# Patient Record
Sex: Female | Born: 1955 | Race: White | Hispanic: No | Marital: Married | Smoking: Never smoker
Health system: Southern US, Community
[De-identification: ages and names within clinical notes are randomized; demographics above are authoritative.]

## PROBLEM LIST (undated history)

## (undated) HISTORY — PX: TONSILLECTOMY: SUR1361

---

## 1999-10-18 ENCOUNTER — Other Ambulatory Visit: Admission: RE | Admit: 1999-10-18 | Discharge: 1999-10-18 | Payer: Self-pay | Admitting: Obstetrics & Gynecology

## 2001-02-19 ENCOUNTER — Emergency Department (HOSPITAL_COMMUNITY): Admission: EM | Admit: 2001-02-19 | Discharge: 2001-02-19 | Payer: Self-pay | Admitting: Emergency Medicine

## 2001-03-19 ENCOUNTER — Encounter: Payer: Self-pay | Admitting: Internal Medicine

## 2001-03-19 ENCOUNTER — Ambulatory Visit (HOSPITAL_COMMUNITY): Admission: RE | Admit: 2001-03-19 | Discharge: 2001-03-19 | Payer: Self-pay | Admitting: Internal Medicine

## 2001-03-22 ENCOUNTER — Encounter: Payer: Self-pay | Admitting: Internal Medicine

## 2001-03-22 ENCOUNTER — Ambulatory Visit (HOSPITAL_COMMUNITY): Admission: RE | Admit: 2001-03-22 | Discharge: 2001-03-22 | Payer: Self-pay | Admitting: Internal Medicine

## 2001-04-07 ENCOUNTER — Ambulatory Visit (HOSPITAL_COMMUNITY): Admission: RE | Admit: 2001-04-07 | Discharge: 2001-04-07 | Payer: Self-pay | Admitting: Internal Medicine

## 2001-04-08 ENCOUNTER — Encounter: Payer: Self-pay | Admitting: Internal Medicine

## 2001-05-28 ENCOUNTER — Ambulatory Visit (HOSPITAL_COMMUNITY): Admission: RE | Admit: 2001-05-28 | Discharge: 2001-05-28 | Payer: Self-pay | Admitting: Internal Medicine

## 2003-05-06 ENCOUNTER — Other Ambulatory Visit: Admission: RE | Admit: 2003-05-06 | Discharge: 2003-05-06 | Payer: Self-pay | Admitting: Obstetrics and Gynecology

## 2004-04-25 ENCOUNTER — Other Ambulatory Visit: Admission: RE | Admit: 2004-04-25 | Discharge: 2004-04-25 | Payer: Self-pay | Admitting: Obstetrics and Gynecology

## 2005-05-23 ENCOUNTER — Other Ambulatory Visit: Admission: RE | Admit: 2005-05-23 | Discharge: 2005-05-23 | Payer: Self-pay | Admitting: Obstetrics and Gynecology

## 2016-12-31 DIAGNOSIS — Z1389 Encounter for screening for other disorder: Secondary | ICD-10-CM | POA: Diagnosis not present

## 2016-12-31 DIAGNOSIS — R0602 Shortness of breath: Secondary | ICD-10-CM | POA: Diagnosis not present

## 2017-04-14 DIAGNOSIS — Z803 Family history of malignant neoplasm of breast: Secondary | ICD-10-CM | POA: Diagnosis not present

## 2017-04-14 DIAGNOSIS — Z1231 Encounter for screening mammogram for malignant neoplasm of breast: Secondary | ICD-10-CM | POA: Diagnosis not present

## 2018-03-30 DIAGNOSIS — Z01419 Encounter for gynecological examination (general) (routine) without abnormal findings: Secondary | ICD-10-CM | POA: Diagnosis not present

## 2018-03-30 DIAGNOSIS — Z124 Encounter for screening for malignant neoplasm of cervix: Secondary | ICD-10-CM | POA: Diagnosis not present

## 2018-04-20 DIAGNOSIS — N950 Postmenopausal bleeding: Secondary | ICD-10-CM | POA: Diagnosis not present

## 2018-04-27 DIAGNOSIS — Z1211 Encounter for screening for malignant neoplasm of colon: Secondary | ICD-10-CM | POA: Diagnosis not present

## 2018-04-27 DIAGNOSIS — Z1231 Encounter for screening mammogram for malignant neoplasm of breast: Secondary | ICD-10-CM | POA: Diagnosis not present

## 2018-04-27 DIAGNOSIS — Z803 Family history of malignant neoplasm of breast: Secondary | ICD-10-CM | POA: Diagnosis not present

## 2018-05-01 DIAGNOSIS — N6002 Solitary cyst of left breast: Secondary | ICD-10-CM | POA: Diagnosis not present

## 2018-06-22 DIAGNOSIS — E663 Overweight: Secondary | ICD-10-CM | POA: Diagnosis not present

## 2018-06-22 DIAGNOSIS — E782 Mixed hyperlipidemia: Secondary | ICD-10-CM | POA: Diagnosis not present

## 2018-06-22 DIAGNOSIS — Z1389 Encounter for screening for other disorder: Secondary | ICD-10-CM | POA: Diagnosis not present

## 2018-06-22 DIAGNOSIS — E063 Autoimmune thyroiditis: Secondary | ICD-10-CM | POA: Diagnosis not present

## 2018-06-22 DIAGNOSIS — R7309 Other abnormal glucose: Secondary | ICD-10-CM | POA: Diagnosis not present

## 2019-07-12 DIAGNOSIS — E063 Autoimmune thyroiditis: Secondary | ICD-10-CM | POA: Diagnosis not present

## 2019-07-12 DIAGNOSIS — Z6827 Body mass index (BMI) 27.0-27.9, adult: Secondary | ICD-10-CM | POA: Diagnosis not present

## 2019-07-12 DIAGNOSIS — E663 Overweight: Secondary | ICD-10-CM | POA: Diagnosis not present

## 2019-08-11 ENCOUNTER — Encounter (INDEPENDENT_AMBULATORY_CARE_PROVIDER_SITE_OTHER): Payer: Self-pay

## 2019-08-11 ENCOUNTER — Encounter: Payer: Self-pay | Admitting: "Endocrinology

## 2019-08-11 ENCOUNTER — Ambulatory Visit: Payer: BC Managed Care – PPO | Admitting: "Endocrinology

## 2019-08-11 ENCOUNTER — Other Ambulatory Visit: Payer: Self-pay

## 2019-08-11 VITALS — BP 113/79 | HR 76 | Ht 62.75 in | Wt 163.0 lb

## 2019-08-11 DIAGNOSIS — E039 Hypothyroidism, unspecified: Secondary | ICD-10-CM

## 2019-08-11 NOTE — Progress Notes (Signed)
Endocrinology Consult Note                                            08/11/2019, 3:54 PM   Subjective:    Patient ID: Lynn Velasquez, female    DOB: 12/28/1955, PCP Benita StabileHall, John Z, MD   History reviewed. No pertinent past medical history. Past Surgical History:  Procedure Laterality Date  . CESAREAN SECTION    . TONSILLECTOMY     Social History   Socioeconomic History  . Marital status: Married    Spouse name: Not on file  . Number of children: Not on file  . Years of education: Not on file  . Highest education level: Not on file  Occupational History  . Not on file  Social Needs  . Financial resource strain: Not on file  . Food insecurity    Worry: Not on file    Inability: Not on file  . Transportation needs    Medical: Not on file    Non-medical: Not on file  Tobacco Use  . Smoking status: Never Smoker  . Smokeless tobacco: Never Used  Substance and Sexual Activity  . Alcohol use: Yes    Comment: Ocassional  . Drug use: Never  . Sexual activity: Not on file  Lifestyle  . Physical activity    Days per week: Not on file    Minutes per session: Not on file  . Stress: Not on file  Relationships  . Social Musicianconnections    Talks on phone: Not on file    Gets together: Not on file    Attends religious service: Not on file    Active member of club or organization: Not on file    Attends meetings of clubs or organizations: Not on file    Relationship status: Not on file  Other Topics Concern  . Not on file  Social History Narrative  . Not on file   Family History  Problem Relation Age of Onset  . Thyroid disease Mother   . CAD Mother   . Stroke Mother   . Breast cancer Mother    Outpatient Encounter Medications as of 08/11/2019  Medication Sig  . pravastatin (PRAVACHOL) 40 MG tablet Take 40 mg by mouth daily.  Marland Kitchen. buPROPion (WELLBUTRIN XL) 150 MG 24 hr tablet daily.  Marland Kitchen. levothyroxine (SYNTHROID) 125 MCG tablet daily.   No facility-administered  encounter medications on file as of 08/11/2019.    ALLERGIES: No Known Allergies  VACCINATION STATUS:  There is no immunization history on file for this patient.  HPI Lynn Velasquez is 63 y.o. female who presents today with a medical history as above. she is being seen in consultation for hypothyroidism requested by Benita StabileHall, John Z, MD.  -History is obtained directly from the patient.  She is not a good historian, however reports that she was diagnosed with hypothyroidism approximately 10 years ago.  She has taken levothyroxine for the same duration of time.  She is currently on levothyroxine 125 mcg p.o. daily.  -She has been taking this medication along with her other medications with breakfast.  She denies dysphagia, shortness of breath, nor voice change.  She denies palpitations, tremors, heat intolerance.  She has been dealing with fluctuating body weight. She does not have recent thyroid function test to review, TSH was 6.9 on June 22, 2018.  Review of Systems  Constitutional:  + Fluctuating body weight, no fatigue, no subjective hyperthermia, no subjective hypothermia Eyes: no blurry vision, no xerophthalmia ENT: no sore throat, no nodules palpated in throat, no dysphagia/odynophagia, no hoarseness Cardiovascular: no Chest Pain, no Shortness of Breath, no palpitations, no leg swelling Respiratory: no cough, no shortness of breath Gastrointestinal: no Nausea/Vomiting/Diarhhea Musculoskeletal: no muscle/joint aches Skin: no rashes Neurological: no tremors, no numbness, no tingling, no dizziness Psychiatric: no depression, no anxiety  Objective:    BP 113/79   Pulse 76   Ht 5' 2.75" (1.594 m)   Wt 163 lb (73.9 kg)   BMI 29.10 kg/m   Wt Readings from Last 3 Encounters:  08/11/19 163 lb (73.9 kg)    Physical Exam  Constitutional:  Body mass index is 29.1 kg/m.,  not in acute distress, normal state of mind Eyes: PERRLA, EOMI, no exophthalmos ENT: moist mucous  membranes, no gross thyromegaly, no gross cervical lymphadenopathy Cardiovascular: normal precordial activity, Regular Rate and Rhythm, no Murmur/Rubs/Gallops Respiratory:  adequate breathing efforts, no gross chest deformity, Clear to auscultation bilaterally Gastrointestinal: abdomen soft, Non -tender, No distension, Bowel Sounds present, no gross organomegaly Musculoskeletal: no gross deformities, strength intact in all four extremities Skin: moist, warm, no rashes Neurological: no tremor with outstretched hands, Deep tendon reflexes normal in bilateral lower extremities.   TSH 6.94 on June 22, 2018   Assessment & Plan:   1. Hypothyroidism, unspecified type  - Lynn Velasquez  is being seen at a kind request of [place] Crane, Edwinna Areola, MD. - I have reviewed her available thyroid records and clinically evaluated the patient. - Based on these reviews, she has longstanding hypothyroidism on levothyroxine.  However she does not have recent thyroid function tests to review. -She did have thyroid biopsy in 2002, per patient results are not available.  Patient was informed that it was benign.  She agrees to go to lab today for a new set of thyroid function tests. In the meantime, she is advised to continue levothyroxine 125 mcg p.o. daily before breakfast.   - We discussed about the correct intake of her thyroid hormone, on empty stomach at fasting, with water, separated by at least 30 minutes from breakfast and other medications,  and separated by more than 4 hours from calcium, iron, multivitamins, acid reflux medications (PPIs). -Patient is made aware of the fact that thyroid hormone replacement is needed for life, dose to be adjusted by periodic monitoring of thyroid function tests.  - I did not initiate any new prescriptions today.  She will be on phone visit in 7 days to discuss her labs and treatment adjustment if necessary.  Due to absence of gross goiter, she will not thyroid imaging at  this time,. -However, due to palpable thyroid and history of multinodular goiter, she will be considered for thyroid ultrasound in 1 year.   - she is advised to maintain close follow up with Celene Squibb, MD for primary care needs.   - Time spent with the patient: 60 minutes, of which >50% was spent in obtaining information about her symptoms, reviewing her previous labs/studies,  evaluations, and treatments, counseling her about her hypothyroidism, and developing a plan to confirm the diagnosis and long term treatment based on the latest standards of care/guidelines.    Crista Luria participated in the discussions, expressed understanding, and voiced agreement with the above plans.  All questions were answered to her satisfaction. she is encouraged to  contact clinic should she have any questions or concerns prior to her return visit.  Follow up plan: Return in about 1 week (around 08/18/2019), or Phone, for Labs Today- Non-Fasting Ok.   Marquis Lunch, MD [facility] [address] [place], [place] [address] Phone: [phone]  Fax: [phone]     08/11/2019, 3:54 PM  This note was partially dictated with voice recognition software. Similar sounding words can be transcribed inadequately or may not  be corrected upon review.

## 2019-08-12 LAB — T4, FREE: Free T4: 1.8 ng/dL (ref 0.8–1.8)

## 2019-08-12 LAB — TSH: TSH: 0.09 mIU/L — ABNORMAL LOW (ref 0.40–4.50)

## 2019-08-19 ENCOUNTER — Ambulatory Visit (INDEPENDENT_AMBULATORY_CARE_PROVIDER_SITE_OTHER): Payer: BC Managed Care – PPO | Admitting: "Endocrinology

## 2019-08-19 ENCOUNTER — Other Ambulatory Visit: Payer: Self-pay

## 2019-08-19 ENCOUNTER — Encounter: Payer: Self-pay | Admitting: "Endocrinology

## 2019-08-19 ENCOUNTER — Telehealth: Payer: Self-pay | Admitting: "Endocrinology

## 2019-08-19 DIAGNOSIS — E039 Hypothyroidism, unspecified: Secondary | ICD-10-CM

## 2019-08-19 MED ORDER — LEVOTHYROXINE SODIUM 112 MCG PO TABS: 112.0000 ug | ORAL_TABLET | Freq: Every day | ORAL | 1 refills | Status: DC

## 2019-08-19 NOTE — Progress Notes (Signed)
08/19/2019, 5:56 PM                                Endocrinology Telehealth Visit Follow up Note -During COVID -19 Pandemic  I connected with Lynn Velasquez on 08/19/2019   by telephone and verified that I am speaking with the correct person using two identifiers. Lynn Velasquez, Lynn Velasquez. she has verbally consented to this visit. All issues noted in this document were discussed and addressed. The format was not optimal for physical exam.   Subjective:    Patient ID: Lynn Velasquez, female    DOB: [birth year], PCP Lynn Stabile, MD   History reviewed. No pertinent past medical history. Past Surgical History:  Procedure Laterality Date  . CESAREAN SECTION    . TONSILLECTOMY     Social History   Socioeconomic History  . Marital status: Married    Spouse name: Not on file  . Number of children: Not on file  . Years of education: Not on file  . Highest education level: Not on file  Occupational History  . Not on file  Social Needs  . Financial resource strain: Not on file  . Food insecurity    Worry: Not on file    Inability: Not on file  . Transportation needs    Medical: Not on file    Non-medical: Not on file  Tobacco Use  . Smoking status: Never Smoker  . Smokeless tobacco: Never Used  Substance and Sexual Activity  . Alcohol use: Yes    Comment: Ocassional  . Drug use: Never  . Sexual activity: Not on file  Lifestyle  . Physical activity    Days per week: Not on file    Minutes per session: Not on file  . Stress: Not on file  Relationships  . Social Musician on phone: Not on file    Gets together: Not on file    Attends religious service: Not on file    Active member of club or organization: Not on file    Attends meetings of clubs or organizations: Not on file    Relationship status: Not on file  Other Topics Concern  . Not on file  Social History Narrative  . Not on file    Family History  Problem Relation Age of Onset  . Thyroid disease Mother   . CAD Mother   . Stroke Mother   . Breast cancer Mother    Outpatient Encounter Medications as of 08/19/2019  Medication Sig  . buPROPion (WELLBUTRIN XL) 150 MG 24 hr tablet daily.  Marland Kitchen levothyroxine (SYNTHROID) 112 MCG tablet Take 1 tablet (112 mcg total) by mouth daily before breakfast.  . pravastatin (PRAVACHOL) 40 MG tablet Take 40 mg by mouth daily.  . [DISCONTINUED] levothyroxine (SYNTHROID) 125 MCG tablet daily.   No facility-administered encounter medications on file as of 08/19/2019.    ALLERGIES: No Known Allergies  VACCINATION STATUS:  There is no immunization history on file for this patient.  HPI Lynn Velasquez is 63 y.o. female who who is being engaged in telehealth via telephone for follow-up after she  was seen in consultation for hypothyroidism.  She was sent for a new set of thyroid function test during her last visit.       She is not a good historian, however reports that she was diagnosed with hypothyroidism approximately 10 years ago.  She has taken levothyroxine for the same duration of time.  She is currently on levothyroxine 125 mcg p.o. daily.  -She has been taking this medication along with her other medications with breakfast.  She denies dysphagia, shortness of breath, nor voice change.  She denies palpitations, tremors, heat intolerance.  She has been dealing with fluctuating body weight. She does not have recent thyroid function test to review, TSH was 6.9 on June 22, 2018. Her most recent thyroid function tests are consistent with slight over replacement.  Review of Systems Limited as above.  Objective:    There were no vitals taken for this visit.  Wt Readings from Last 3 Encounters:  08/11/19 163 lb (73.9 kg)    Physical Exam  Recent Results (from the past 2160 hour(s))  T4, free SOLSTAS     Status: None   Collection Time: 08/11/19  3:54 PM  Result Value  Ref Range   Free T4 1.8 0.8 - 1.8 ng/dL  TSH SOLSTAS     Status: Abnormal   Collection Time: 08/11/19  3:54 PM  Result Value Ref Range   TSH 0.09 (L) 0.40 - 4.50 mIU/L     TSH 6.94 on June 22, 2018   Assessment & Plan:   1. Hypothyroidism, unspecified type   Her previsit thyroid function tests are consistent with slight over replacement.  I discussed and lowered her levothyroxine to 112 mcg p.o. daily before breakfast.  - We discussed about the correct intake of her thyroid hormone, on empty stomach at fasting, with water, separated by at least 30 minutes from breakfast and other medications,  and separated by more than 4 hours from calcium, iron, multivitamins, acid reflux medications (PPIs). -Patient is made aware of the fact that thyroid hormone replacement is needed for life, dose to be adjusted by periodic monitoring of thyroid function tests.  -She did have thyroid biopsy in 2002, per patient results are not available.  Patient was informed that it was benign. -However, due to palpable thyroid and history of multinodular goiter, she will be considered for thyroid ultrasound in 1 year.      - she is advised to maintain close follow up with Lynn Squibb, MD for primary care needs.  Time for this visit: 15 minutes. Lynn Velasquez  participated in the discussions, expressed understanding, and voiced agreement with the above plans.  All questions were answered to her satisfaction. she is encouraged to contact clinic should she have any questions or concerns prior to her return visit.  Follow up plan: Return in about 4 months (around 12/17/2019) for Follow up with Pre-visit Labs.   Lynn Lloyd, MD [facility] [address] [place], [place] [address] Phone: [phone]  Fax: [phone]     08/19/2019, 5:56 PM  This note was partially dictated with voice recognition software. Similar sounding words can be  transcribed inadequately or may not  be corrected upon review.

## 2019-08-19 NOTE — Telephone Encounter (Signed)
Pt called upset that I left a VM on her home answering machine to remind her of her phone visit today, patient called back yelling at me that I am to not call her husband, advised patient that I never spoke with her husband that I left a message on a home phone, patient gave me her cell and hung up

## 2019-08-23 NOTE — Telephone Encounter (Signed)
Patient came into the office and said she wanted to fill out a new DPR. Patient filled out & I made her a copy and handed to her. New copy of DPR on file.

## 2019-09-02 DIAGNOSIS — E785 Hyperlipidemia, unspecified: Secondary | ICD-10-CM | POA: Diagnosis not present

## 2019-09-02 DIAGNOSIS — E039 Hypothyroidism, unspecified: Secondary | ICD-10-CM | POA: Diagnosis not present

## 2019-09-02 DIAGNOSIS — F331 Major depressive disorder, recurrent, moderate: Secondary | ICD-10-CM | POA: Diagnosis not present

## 2019-09-02 DIAGNOSIS — Z0189 Encounter for other specified special examinations: Secondary | ICD-10-CM | POA: Diagnosis not present

## 2019-12-15 LAB — TSH: TSH: 0.23 mIU/L — ABNORMAL LOW (ref 0.40–4.50)

## 2019-12-15 LAB — T4, FREE: Free T4: 1.3 ng/dL (ref 0.8–1.8)

## 2019-12-17 ENCOUNTER — Ambulatory Visit: Payer: BC Managed Care – PPO | Admitting: "Endocrinology

## 2019-12-20 ENCOUNTER — Ambulatory Visit: Payer: BC Managed Care – PPO | Admitting: "Endocrinology

## 2020-01-05 ENCOUNTER — Ambulatory Visit (INDEPENDENT_AMBULATORY_CARE_PROVIDER_SITE_OTHER): Payer: BC Managed Care – PPO | Admitting: "Endocrinology

## 2020-01-05 ENCOUNTER — Encounter: Payer: Self-pay | Admitting: "Endocrinology

## 2020-01-05 DIAGNOSIS — E039 Hypothyroidism, unspecified: Secondary | ICD-10-CM

## 2020-01-05 MED ORDER — LEVOTHYROXINE SODIUM 112 MCG PO TABS: 112.0000 ug | ORAL_TABLET | Freq: Every day | ORAL | 1 refills | Status: DC

## 2020-01-05 NOTE — Progress Notes (Signed)
01/05/2020, 2:53 PM                                    Endocrinology Telehealth Visit Follow up Note -During COVID -19 Pandemic  I connected with Lynn Velasquez on 01/05/2020   by telephone and verified that I am speaking with the correct person using two identifiers. Lynn Velasquez, 24-Dec-1955. she has verbally consented to this visit. All issues noted in this document were discussed and addressed. The format was not optimal for physical exam.   Subjective:    Patient ID: Lynn Velasquez, female    DOB: 1956/05/25, PCP Benita Stabile, MD   History reviewed. No pertinent past medical history. Past Surgical History:  Procedure Laterality Date  . CESAREAN SECTION    . TONSILLECTOMY     Social History   Socioeconomic History  . Marital status: Married    Spouse name: Not on file  . Number of children: Not on file  . Years of education: Not on file  . Highest education level: Not on file  Occupational History  . Not on file  Tobacco Use  . Smoking status: Never Smoker  . Smokeless tobacco: Never Used  Substance and Sexual Activity  . Alcohol use: Yes    Comment: Ocassional  . Drug use: Never  . Sexual activity: Not on file  Other Topics Concern  . Not on file  Social History Narrative  . Not on file   Social Determinants of Health   Financial Resource Strain:   . Difficulty of Paying Living Expenses:   Food Insecurity:   . Worried About Programme researcher, broadcasting/film/video in the Last Year:   . Barista in the Last Year:   Transportation Needs:   . Freight forwarder (Medical):   Marland Kitchen Lack of Transportation (Non-Medical):   Physical Activity:   . Days of Exercise per Week:   . Minutes of Exercise per Session:   Stress:   . Feeling of Stress :   Social Connections:   . Frequency of Communication with Friends and Family:   . Frequency of Social Gatherings with Friends and Family:   . Attends Religious Services:    . Active Member of Clubs or Organizations:   . Attends Banker Meetings:   Marland Kitchen Marital Status:    Family History  Problem Relation Age of Onset  . Thyroid disease Mother   . CAD Mother   . Stroke Mother   . Breast cancer Mother    Outpatient Encounter Medications as of 01/05/2020  Medication Sig  . buPROPion (WELLBUTRIN XL) 150 MG 24 hr tablet daily.  Marland Kitchen levothyroxine (SYNTHROID) 112 MCG tablet Take 1 tablet (112 mcg total) by mouth daily before breakfast.  . pravastatin (PRAVACHOL) 40 MG tablet Take 40 mg by mouth daily.  . [DISCONTINUED] levothyroxine (SYNTHROID) 112 MCG tablet Take 1 tablet (112 mcg total) by mouth daily before breakfast.   No facility-administered encounter medications on file as of 01/05/2020.   ALLERGIES: No Known Allergies  VACCINATION STATUS:  There is no immunization history on file for this patient.  HPI Lynn Velasquez is 64 y.o. female who who is engaged in telehealth via telephone in follow-up for hypothyroidism.       She was diagnosed with hypothyroidism at approximate age of 21 years.  She has taken levothyroxine for the same duration of time.  She is currently on levothyroxine 112 mcg p.o. daily.  -She has been taking this medication along with her other medications with breakfast.  She denies dysphagia, shortness of breath, nor voice change.  She denies palpitations, tremors, heat intolerance.  She has been dealing with fluctuating body weight, however thinks it is steady since last visit.  Review of Systems Limited as above.  Objective:    There were no vitals taken for this visit.  Wt Readings from Last 3 Encounters:  08/11/19 163 lb (73.9 kg)    Physical Exam   Recent Results (from the past 2160 hour(s))  TSH SOLSTAS     Status: Abnormal   Collection Time: 12/14/19  4:21 PM  Result Value Ref Range   TSH 0.23 (L) 0.40 - 4.50 mIU/L  T4, free SOLSTAS     Status: None   Collection Time: 12/14/19  4:21 PM  Result Value  Ref Range   Free T4 1.3 0.8 - 1.8 ng/dL    Assessment & Plan:   1. Hypothyroidism   Her previsit thyroid function tests are consistent with improving profile and appropriate replacement.  She is advised to continue levothyroxine  112 mcg p.o. daily before breakfast.  - We discussed about the correct intake of her thyroid hormone, on empty stomach at fasting, with water, separated by at least 30 minutes from breakfast and other medications,  and separated by more than 4 hours from calcium, iron, multivitamins, acid reflux medications (PPIs). -Patient is made aware of the fact that thyroid hormone replacement is needed for life, dose to be adjusted by periodic monitoring of thyroid function tests.  -She did have thyroid biopsy in 2002, per patient results are not available.  Patient was informed that it was benign. -However, due to palpable thyroid and history of multinodular goiter, she will be considered for thyroid ultrasound in 1 year.    - she is advised to maintain close follow up with Celene Squibb, MD for primary care needs.     - Time spent on this patient care encounter:  25 minutes of which 50% was spent in  counseling and the rest reviewing  her current and  previous labs / studies and medications  doses and developing a plan for long term care. Crista Luria  participated in the discussions, expressed understanding, and voiced agreement with the above plans.  All questions were answered to her satisfaction. she is encouraged to contact clinic should she have any questions or concerns prior to her return visit.   Follow up plan: Return in about 9 weeks (around 03/08/2020) for Follow up with Pre-visit Labs.   Glade Lloyd, MD [facility] [address] [place], [place] [address] Phone: [phone]  Fax: [phone]     01/05/2020, 2:53 PM  This note was partially dictated with voice recognition software. Similar  sounding words can be transcribed inadequately or may not  be corrected upon review.

## 2020-03-08 LAB — TSH: TSH: 0.09 mIU/L — ABNORMAL LOW (ref 0.40–4.50)

## 2020-03-08 LAB — T4, FREE: Free T4: 1.5 ng/dL (ref 0.8–1.8)

## 2020-03-09 ENCOUNTER — Other Ambulatory Visit: Payer: Self-pay

## 2020-03-09 ENCOUNTER — Ambulatory Visit (INDEPENDENT_AMBULATORY_CARE_PROVIDER_SITE_OTHER): Payer: BC Managed Care – PPO | Admitting: "Endocrinology

## 2020-03-09 ENCOUNTER — Encounter: Payer: Self-pay | Admitting: "Endocrinology

## 2020-03-09 VITALS — BP 103/64 | HR 71 | Ht 62.0 in | Wt 161.4 lb

## 2020-03-09 DIAGNOSIS — E049 Nontoxic goiter, unspecified: Secondary | ICD-10-CM

## 2020-03-09 DIAGNOSIS — E039 Hypothyroidism, unspecified: Secondary | ICD-10-CM | POA: Diagnosis not present

## 2020-03-09 MED ORDER — LEVOTHYROXINE SODIUM 100 MCG PO TABS: 100.0000 ug | ORAL_TABLET | Freq: Every day | ORAL | 1 refills | Status: DC

## 2020-03-09 NOTE — Addendum Note (Signed)
Addended by: Roma Kayser on: 03/09/2020 03:53 PM   Modules accepted: Orders

## 2020-03-09 NOTE — Progress Notes (Signed)
03/09/2020, 3:16 PM         Endocrinology follow-up note   Subjective:    Patient ID: Lynn Velasquez, female    DOB: 02/07/1956, PCP Celene Squibb, MD   History reviewed. No pertinent past medical history. Past Surgical History:  Procedure Laterality Date  . CESAREAN SECTION    . TONSILLECTOMY     Social History   Socioeconomic History  . Marital status: Married    Spouse name: Not on file  . Number of children: Not on file  . Years of education: Not on file  . Highest education level: Not on file  Occupational History  . Not on file  Tobacco Use  . Smoking status: Never Smoker  . Smokeless tobacco: Never Used  Vaping Use  . Vaping Use: Never used  Substance and Sexual Activity  . Alcohol use: Yes    Comment: Ocassional  . Drug use: Never  . Sexual activity: Not on file  Other Topics Concern  . Not on file  Social History Narrative  . Not on file   Social Determinants of Health   Financial Resource Strain:   . Difficulty of Paying Living Expenses:   Food Insecurity:   . Worried About Charity fundraiser in the Last Year:   . Arboriculturist in the Last Year:   Transportation Needs:   . Film/video editor (Medical):   Marland Kitchen Lack of Transportation (Non-Medical):   Physical Activity:   . Days of Exercise per Week:   . Minutes of Exercise per Session:   Stress:   . Feeling of Stress :   Social Connections:   . Frequency of Communication with Friends and Family:   . Frequency of Social Gatherings with Friends and Family:   . Attends Religious Services:   . Active Member of Clubs or Organizations:   . Attends Archivist Meetings:   Marland Kitchen Marital Status:    Family History  Problem Relation Age of Onset  . Thyroid disease Mother   . CAD Mother   . Stroke Mother   . Breast cancer Mother    Outpatient Encounter Medications as of 03/09/2020  Medication Sig  . levothyroxine (SYNTHROID) 100 MCG  tablet Take 1 tablet (100 mcg total) by mouth daily before breakfast.  . [DISCONTINUED] buPROPion (WELLBUTRIN XL) 150 MG 24 hr tablet daily.  . [DISCONTINUED] levothyroxine (SYNTHROID) 112 MCG tablet Take 1 tablet (112 mcg total) by mouth daily before breakfast.  . [DISCONTINUED] pravastatin (PRAVACHOL) 40 MG tablet Take 40 mg by mouth daily.   No facility-administered encounter medications on file as of 03/09/2020.   ALLERGIES: No Known Allergies  VACCINATION STATUS:  There is no immunization history on file for this patient.  HPI Lynn Velasquez is 64 y.o. female who who is being seen in follow-up for hypothyroidism, history of nodular goiter.    She was diagnosed with hypothyroidism at approximate age of 73 years.  She is more consistent with this medication and taking it the right way this time.   -Her previsit thyroid function tests are consistent with slight over replacement.   - She denies dysphagia, shortness of breath, nor voice change.  She  reports mild intermittent hot flashes, palpitations.   -She has more steady body weight at this time.    Review of Systems Limited as above.  Objective:    BP 103/64   Pulse 71   Ht 5\' 2"  (1.575 m)   Wt 161 lb 6.4 oz (73.2 kg)   BMI 29.52 kg/m   Wt Readings from Last 3 Encounters:  03/09/20 161 lb 6.4 oz (73.2 kg)  08/11/19 163 lb (73.9 kg)    Physical Exam   Physical Exam- Limited  Constitutional:  Body mass index is 29.52 kg/m. , not in acute distress, normal state of mind Eyes:  EOMI, no exophthalmos Neck: Supple Thyroid: No gross goiter Respiratory: Adequate breathing efforts Musculoskeletal: no gross deformities, strength intact in all four extremities, no gross restriction of joint movements Skin:  no rashes, no hyperemia Neurological: no tremor with outstretched hands,    Recent Results (from the past 2160 hour(s))  TSH SOLSTAS     Status: Abnormal   Collection Time: 12/14/19  4:21 PM  Result Value Ref  Range   TSH 0.23 (L) 0.40 - 4.50 mIU/L  T4, free SOLSTAS     Status: None   Collection Time: 12/14/19  4:21 PM  Result Value Ref Range   Free T4 1.3 0.8 - 1.8 ng/dL  T4, free     Status: None   Collection Time: 03/07/20 10:39 AM  Result Value Ref Range   Free T4 1.5 0.8 - 1.8 ng/dL  TSH     Status: Abnormal   Collection Time: 03/07/20 10:39 AM  Result Value Ref Range   TSH 0.09 (L) 0.40 - 4.50 mIU/L    Assessment & Plan:   1. Hypothyroidism  2.  History of nodular goiter status post biopsy.   Her previsit thyroid function tests are consistent with slight over replacement.  She is approached for a lower dose of levothyroxine and she accepts.  I discussed and prescribed levothyroxine 100 mcg p.o. daily before breakfast.    - We discussed about the correct intake of her thyroid hormone, on empty stomach at fasting, with water, separated by at least 30 minutes from breakfast and other medications,  and separated by more than 4 hours from calcium, iron, multivitamins, acid reflux medications (PPIs). -Patient is made aware of the fact that thyroid hormone replacement is needed for life, dose to be adjusted by periodic monitoring of thyroid function tests.   -She did have thyroid biopsy in 2002, per patient results are not available.  Patient was informed that it was benign. -However, due to palpable thyroid and history of multinodular goiter, she will have surveillance thyroid ultrasound before her next visit.     - she is advised to maintain close follow up with 2003, MD for primary care needs.      - Time spent on this patient care encounter:  20 minutes of which 50% was spent in  counseling and the rest reviewing  her current and  previous labs / studies and medications  doses and developing a plan for long term care. Benita Stabile  participated in the discussions, expressed understanding, and voiced agreement with the above plans.  All questions were answered to her  satisfaction. she is encouraged to contact clinic should she have any questions or concerns prior to her return visit.    Follow up plan: Return in about 6 months (around 09/08/2020) for Thyroid / Neck Ultrasound, F/U with Pre-visit Labs.   14/06/2020,  MD [facility] [address] [place], [place] [address] Phone: [phone]  Fax: [phone]     03/09/2020, 3:16 PM  This note was partially dictated with voice recognition software. Similar sounding words can be transcribed inadequately or may not  be corrected upon review.

## 2020-03-15 ENCOUNTER — Encounter: Payer: Self-pay | Admitting: "Endocrinology

## 2020-09-06 ENCOUNTER — Ambulatory Visit (HOSPITAL_COMMUNITY): Admission: RE | Admit: 2020-09-06 | Payer: BC Managed Care – PPO | Source: Ambulatory Visit

## 2020-09-06 LAB — TSH: TSH: 0.08 mIU/L — ABNORMAL LOW (ref 0.40–4.50)

## 2020-09-06 LAB — THYROGLOBULIN ANTIBODY: Thyroglobulin Ab: 9 IU/mL — ABNORMAL HIGH (ref <=1)

## 2020-09-06 LAB — T4, FREE: Free T4: 1.4 ng/dL (ref 0.8–1.8)

## 2020-09-06 LAB — THYROID PEROXIDASE ANTIBODY: Thyroperoxidase Ab SerPl-aCnc: 632 IU/mL — ABNORMAL HIGH (ref <9)

## 2020-09-07 ENCOUNTER — Telehealth: Payer: Self-pay

## 2020-09-07 NOTE — Telephone Encounter (Signed)
Patient did not do ultrasound but did her labs-do you want to keep her on the schedule for monday

## 2020-09-07 NOTE — Telephone Encounter (Signed)
Yes

## 2020-09-11 ENCOUNTER — Ambulatory Visit (INDEPENDENT_AMBULATORY_CARE_PROVIDER_SITE_OTHER): Payer: BC Managed Care – PPO | Admitting: "Endocrinology

## 2020-09-11 ENCOUNTER — Encounter: Payer: Self-pay | Admitting: "Endocrinology

## 2020-09-11 ENCOUNTER — Other Ambulatory Visit: Payer: Self-pay

## 2020-09-11 VITALS — BP 110/78 | HR 80 | Ht 62.0 in | Wt 160.0 lb

## 2020-09-11 DIAGNOSIS — E038 Other specified hypothyroidism: Secondary | ICD-10-CM

## 2020-09-11 DIAGNOSIS — E049 Nontoxic goiter, unspecified: Secondary | ICD-10-CM

## 2020-09-11 DIAGNOSIS — E063 Autoimmune thyroiditis: Secondary | ICD-10-CM | POA: Diagnosis not present

## 2020-09-11 MED ORDER — LEVOTHYROXINE SODIUM 100 MCG PO TABS: 100.0000 ug | ORAL_TABLET | Freq: Every day | ORAL | 1 refills | Status: DC

## 2020-09-11 NOTE — Progress Notes (Signed)
09/11/2020, 7:28 PM         Endocrinology follow-up note   Subjective:    Patient ID: Lynn Velasquez, female    DOB: 08-30-1956, PCP Benita Stabile, MD   History reviewed. No pertinent past medical history. Past Surgical History:  Procedure Laterality Date  . CESAREAN SECTION    . TONSILLECTOMY     Social History   Socioeconomic History  . Marital status: Married    Spouse name: Not on file  . Number of children: Not on file  . Years of education: Not on file  . Highest education level: Not on file  Occupational History  . Not on file  Tobacco Use  . Smoking status: Never Smoker  . Smokeless tobacco: Never Used  Vaping Use  . Vaping Use: Never used  Substance and Sexual Activity  . Alcohol use: Yes    Comment: Ocassional  . Drug use: Never  . Sexual activity: Not on file  Other Topics Concern  . Not on file  Social History Narrative  . Not on file   Social Determinants of Health   Financial Resource Strain: Not on file  Food Insecurity: Not on file  Transportation Needs: Not on file  Physical Activity: Not on file  Stress: Not on file  Social Connections: Not on file   Family History  Problem Relation Age of Onset  . Thyroid disease Mother   . CAD Mother   . Stroke Mother   . Breast cancer Mother    Outpatient Encounter Medications as of 09/11/2020  Medication Sig  . levothyroxine (SYNTHROID) 100 MCG tablet Take 1 tablet (100 mcg total) by mouth daily before breakfast.  . pravastatin (PRAVACHOL) 40 MG tablet Take 40 mg by mouth at bedtime.  . [DISCONTINUED] levothyroxine (SYNTHROID) 100 MCG tablet Take 1 tablet (100 mcg total) by mouth daily before breakfast.   No facility-administered encounter medications on file as of 09/11/2020.   ALLERGIES: No Known Allergies  VACCINATION STATUS:  There is no immunization history on file for this patient.  HPI Lynn Velasquez is 64 y.o. female  who who is being seen in follow-up for hypothyroidism, history of nodular goiter.    She was diagnosed with hypothyroidism at approximate age of 59 years. She is currently on levothyroxine, 100 g p.o. daily before breakfast. She reports better compliance and consistency taking her medication. -Her previsit thyroid function tests are consistent with slight over replacement.   - She denies dysphagia, shortness of breath, nor voice change.  She reports mild intermittent hot flashes, palpitations.   -She has more steady body weight at this time.    Review of Systems Limited as above.  Objective:    BP 110/78   Pulse 80   Ht 5\' 2"  (1.575 m)   Wt 160 lb (72.6 kg)   BMI 29.26 kg/m   Wt Readings from Last 3 Encounters:  09/11/20 160 lb (72.6 kg)  03/09/20 161 lb 6.4 oz (73.2 kg)  08/11/19 163 lb (73.9 kg)    Physical Exam   Physical Exam- Limited  Constitutional:  Body mass index is 29.26 kg/m. , not in acute distress, normal state of mind Eyes:  EOMI, no  exophthalmos Neck: Supple Thyroid: +Palpable thyroid Respiratory: Adequate breathing efforts Musculoskeletal: no gross deformities, strength intact in all four extremities, no gross restriction of joint movements Skin:  no rashes, no hyperemia Neurological: no tremor with outstretched hands,    Recent Results (from the past 2160 hour(s))  Thyroid peroxidase antibody     Status: Abnormal   Collection Time: 09/05/20  4:12 PM  Result Value Ref Range   Thyroperoxidase Ab SerPl-aCnc 632 (H) <9 IU/mL  Thyroglobulin antibody     Status: Abnormal   Collection Time: 09/05/20  4:12 PM  Result Value Ref Range   Thyroglobulin Ab 9 (H) < or = 1 IU/mL  TSH     Status: Abnormal   Collection Time: 09/05/20  4:12 PM  Result Value Ref Range   TSH 0.08 (L) 0.40 - 4.50 mIU/L  T4, free     Status: None   Collection Time: 09/05/20  4:12 PM  Result Value Ref Range   Free T4 1.4 0.8 - 1.8 ng/dL    Assessment & Plan:   1. Hypothyroidism  due to Hashimoto's thyroiditis 2.  History of nodular goiter status post biopsy.   Her previsit thyroid function tests are consistent with appropriate replacement.  -Her hypothyroidism is now confirmed to be due to Hashimoto's thyroiditis. She is advised to continue levothyroxine 100 g p.o. daily before breakfast.   - We discussed about the correct intake of her thyroid hormone, on empty stomach at fasting, with water, separated by at least 30 minutes from breakfast and other medications,  and separated by more than 4 hours from calcium, iron, multivitamins, acid reflux medications (PPIs). -Patient is made aware of the fact that thyroid hormone replacement is needed for life, dose to be adjusted by periodic monitoring of thyroid function tests.   -She did have thyroid biopsy in 2002, per patient results are not available.  Patient was informed that it was benign. -However, due to palpable thyroid and history of multinodular goiter, she missed her appointment for thyroid ultrasound since last visit. She will have surveillance thyroid ultrasound before her next visit.     - she is advised to maintain close follow up with Benita Stabile, MD for primary care needs.    - Time spent on this patient care encounter:  20 minutes of which 50% was spent in  counseling and the rest reviewing  her current and  previous labs / studies and medications  doses and developing a plan for long term care. Lynn Velasquez  participated in the discussions, expressed understanding, and voiced agreement with the above plans.  All questions were answered to her satisfaction. she is encouraged to contact clinic should she have any questions or concerns prior to her return visit.   Follow up plan: Return in about 6 months (around 03/12/2021), or ultrasound already ordered, patient will call for appointment, for F/U with Pre-visit Labs, Thyroid / Neck Ultrasound.   Marquis Lunch, MD [facility] [place] [address] [place], [place] [address] Phone: [phone]  Fax: [phone]     09/11/2020, 7:28 PM  This note was partially dictated with voice recognition software. Similar sounding words can be transcribed inadequately or may not  be corrected upon review.

## 2020-09-18 ENCOUNTER — Ambulatory Visit (HOSPITAL_COMMUNITY)
Admission: RE | Admit: 2020-09-18 | Discharge: 2020-09-18 | Disposition: A | Discharge status: Home / Self Care | Payer: BC Managed Care – PPO | Source: Ambulatory Visit | Attending: "Endocrinology | Admitting: "Endocrinology

## 2020-09-18 ENCOUNTER — Other Ambulatory Visit: Payer: Self-pay

## 2020-09-18 DIAGNOSIS — E049 Nontoxic goiter, unspecified: Secondary | ICD-10-CM | POA: Insufficient documentation

## 2021-03-13 ENCOUNTER — Ambulatory Visit: Payer: BC Managed Care – PPO | Admitting: "Endocrinology

## 2021-03-20 ENCOUNTER — Other Ambulatory Visit: Payer: Self-pay

## 2021-03-20 DIAGNOSIS — E038 Other specified hypothyroidism: Secondary | ICD-10-CM

## 2021-03-21 LAB — T4, FREE: Free T4: 1.75 ng/dL (ref 0.82–1.77)

## 2021-03-21 LAB — TSH: TSH: 0.056 u[IU]/mL — ABNORMAL LOW (ref 0.450–4.500)

## 2021-03-27 ENCOUNTER — Other Ambulatory Visit: Payer: Self-pay

## 2021-03-27 ENCOUNTER — Encounter: Payer: Self-pay | Admitting: "Endocrinology

## 2021-03-27 ENCOUNTER — Ambulatory Visit (INDEPENDENT_AMBULATORY_CARE_PROVIDER_SITE_OTHER): Payer: BC Managed Care – PPO | Admitting: "Endocrinology

## 2021-03-27 VITALS — BP 110/64 | HR 68 | Ht 62.0 in | Wt 165.4 lb

## 2021-03-27 DIAGNOSIS — E038 Other specified hypothyroidism: Secondary | ICD-10-CM | POA: Diagnosis not present

## 2021-03-27 DIAGNOSIS — E049 Nontoxic goiter, unspecified: Secondary | ICD-10-CM

## 2021-03-27 DIAGNOSIS — E063 Autoimmune thyroiditis: Secondary | ICD-10-CM | POA: Diagnosis not present

## 2021-03-27 MED ORDER — LEVOTHYROXINE SODIUM 88 MCG PO TABS: 88.0000 ug | ORAL_TABLET | Freq: Every day | ORAL | 1 refills | Status: DC

## 2021-03-27 NOTE — Progress Notes (Signed)
03/27/2021, 8:39 AM         Endocrinology follow-up note   Subjective:    Patient ID: Lynn Velasquez, female    DOB: 1956-01-26, PCP Lynn Stabile, MD   History reviewed. No pertinent past medical history. Past Surgical History:  Procedure Laterality Date   CESAREAN SECTION     TONSILLECTOMY     Social History   Socioeconomic History   Marital status: Married    Spouse name: Not on file   Number of children: Not on file   Years of education: Not on file   Highest education level: Not on file  Occupational History   Not on file  Tobacco Use   Smoking status: Never   Smokeless tobacco: Never  Vaping Use   Vaping Use: Never used  Substance and Sexual Activity   Alcohol use: Yes    Comment: Ocassional   Drug use: Never   Sexual activity: Not on file  Other Topics Concern   Not on file  Social History Narrative   Not on file   Social Determinants of Health   Financial Resource Strain: Not on file  Food Insecurity: Not on file  Transportation Needs: Not on file  Physical Activity: Not on file  Stress: Not on file  Social Connections: Not on file   Family History  Problem Relation Age of Onset   Thyroid disease Mother    CAD Mother    Stroke Mother    Breast cancer Mother    Outpatient Encounter Medications as of 03/27/2021  Medication Sig   levothyroxine (SYNTHROID) 88 MCG tablet Take 1 tablet (88 mcg total) by mouth daily before breakfast.   pravastatin (PRAVACHOL) 40 MG tablet Take 40 mg by mouth at bedtime.   [DISCONTINUED] levothyroxine (SYNTHROID) 100 MCG tablet Take 1 tablet (100 mcg total) by mouth daily before breakfast.   No facility-administered encounter medications on file as of 03/27/2021.   ALLERGIES: Allergies  Allergen Reactions   Zithromax [Azithromycin] Rash    VACCINATION STATUS:  There is no immunization history on file for this patient.  HPI Lynn Velasquez is 65 y.o.  female who who is being seen in follow-up for hypothyroidism, history of nodular goiter.    She was diagnosed with hypothyroidism at approximate age of 67 years secondary to Hashimoto thyroiditis.  She is currently on levothyroxine 100 mcg p.o. daily before breakfast.  She reports very good compliance and consistency taking her medication.  Her previsit labs are consistent with slight over replacement.   She has steady weight, denies any palpitations, tremors, or any new complaints.   - She denies dysphagia, shortness of breath, nor voice change.  Her thyroid/neck ultrasound in December 2021 where unremarkable except heterogeneous thyroid consistent with Hashimoto's thyroiditis.  No discrete nodules.       Review of Systems Limited as above.  Objective:    BP 110/64   Pulse 68   Ht 5\' 2"  (1.575 m)   Wt 165 lb 6.4 oz (75 kg)   BMI 30.25 kg/m   Wt Readings from Last 3 Encounters:  03/27/21 165 lb 6.4 oz (75 kg)  09/11/20 160 lb (72.6 kg)  03/09/20 161 lb 6.4 oz (  73.2 kg)    Physical Exam   Physical Exam- Limited  Constitutional:  Body mass index is 30.25 kg/m. , not in acute distress, normal state of mind   Recent Results (from the past 2160 hour(s))  T4, Free     Status: None   Collection Time: 03/20/21 10:38 AM  Result Value Ref Range   Free T4 1.75 0.82 - 1.77 ng/dL  TSH     Status: Abnormal   Collection Time: 03/20/21 10:38 AM  Result Value Ref Range   TSH 0.056 (L) 0.450 - 4.500 uIU/mL    Assessment & Plan:   1. Hypothyroidism due to Hashimoto's thyroiditis 2.  History of nodular goiter status post biopsy.   Her previsit thyroid function tests are consistent with slight over replacement.  I discussed and lowered her levothyroxine to 88 mcg p.o. daily before breakfast.     - We discussed about the correct intake of her thyroid hormone, on empty stomach at fasting, with water, separated by at least 30 minutes from breakfast and other medications,  and separated by  more than 4 hours from calcium, iron, multivitamins, acid reflux medications (PPIs). -Patient is made aware of the fact that thyroid hormone replacement is needed for life, dose to be adjusted by periodic monitoring of thyroid function tests.    -She did have thyroid biopsy in 2002, per patient results are not available.  Patient was informed that it was benign. -However, due to palpable thyroid and history of multinodular goiter, she was offered surveillance thyroid ultrasound which she completed in December 2021.  Did not show any discrete nodules, heterogeneity consistent with medical history of Hashimoto's thyroiditis.    - she is advised to maintain close follow up with Lynn Stabile, MD for primary care needs.   I spent 21 minutes in the care of the patient today including review of labs from Thyroid Function, CMP, and other relevant labs ; imaging/biopsy records (current and previous including abstractions from other facilities); face-to-face time discussing  her lab results and symptoms, medications doses, her options of short and long term treatment based on the latest standards of care / guidelines;   and documenting the encounter.  Lynn Velasquez  participated in the discussions, expressed understanding, and voiced agreement with the above plans.  All questions were answered to her satisfaction. she is encouraged to contact clinic should she have any questions or concerns prior to her return visit.   Follow up plan: Return in about 6 months (around 09/26/2021) for F/U with Pre-visit Labs.   Marquis Lunch, MD [facility] [address] [place], [place] [address] Phone: [phone]  Fax: [phone]     03/27/2021, 8:39 AM  This note was partially dictated with voice recognition software. Similar sounding words can be transcribed inadequately or may not  be corrected upon review.

## 2021-09-18 ENCOUNTER — Telehealth: Payer: Self-pay | Admitting: "Endocrinology

## 2021-09-18 DIAGNOSIS — E063 Autoimmune thyroiditis: Secondary | ICD-10-CM

## 2021-09-18 MED ORDER — LEVOTHYROXINE SODIUM 88 MCG PO TABS: 88.0000 ug | ORAL_TABLET | Freq: Every day | ORAL | 0 refills | Status: DC

## 2021-09-18 NOTE — Telephone Encounter (Signed)
Pt's last office note states for her to continue levothyroxine but her Rx shows and end date of 06/25/21. Just want confirmation to continue pt's levothyroxine .

## 2021-09-18 NOTE — Telephone Encounter (Signed)
Pt is calling and requesting a refill on her levothyroxine, when I pull it up it says it was stopped in September. Can you check into this.  [facility] [address], [place] - [id number] [place] [address] Phone:  [phone]  Fax:  [phone]

## 2021-09-18 NOTE — Telephone Encounter (Signed)
Rx refill sent to [facility]. 

## 2021-09-27 ENCOUNTER — Ambulatory Visit: Payer: BC Managed Care – PPO | Admitting: "Endocrinology

## 2021-09-28 LAB — TSH: TSH: 0.085 u[IU]/mL — ABNORMAL LOW (ref 0.450–4.500)

## 2021-09-28 LAB — T4, FREE: Free T4: 1.79 ng/dL — ABNORMAL HIGH (ref 0.82–1.77)

## 2021-10-03 ENCOUNTER — Ambulatory Visit (INDEPENDENT_AMBULATORY_CARE_PROVIDER_SITE_OTHER): Payer: BC Managed Care – PPO | Admitting: "Endocrinology

## 2021-10-03 ENCOUNTER — Encounter: Payer: Self-pay | Admitting: "Endocrinology

## 2021-10-03 ENCOUNTER — Other Ambulatory Visit: Payer: Self-pay

## 2021-10-03 VITALS — BP 122/66 | HR 76 | Ht 62.0 in | Wt 166.4 lb

## 2021-10-03 DIAGNOSIS — E063 Autoimmune thyroiditis: Secondary | ICD-10-CM

## 2021-10-03 DIAGNOSIS — E038 Other specified hypothyroidism: Secondary | ICD-10-CM

## 2021-10-03 MED ORDER — LEVOTHYROXINE SODIUM 75 MCG PO TABS: 75.0000 ug | ORAL_TABLET | Freq: Every day | ORAL | 1 refills | Status: AC

## 2021-10-03 NOTE — Progress Notes (Signed)
10/03/2021, 3:04 PM         Endocrinology follow-up note   Subjective:    Patient ID: Lynn Velasquez, female    DOB: [birth year], PCP Benita Stabile, MD   History reviewed. No pertinent past medical history. Past Surgical History:  Procedure Laterality Date   CESAREAN SECTION     TONSILLECTOMY     Social History   Socioeconomic History   Marital status: Married    Spouse name: Not on file   Number of children: Not on file   Years of education: Not on file   Highest education level: Not on file  Occupational History   Not on file  Tobacco Use   Smoking status: Never   Smokeless tobacco: Never  Vaping Use   Vaping Use: Never used  Substance and Sexual Activity   Alcohol use: Yes    Comment: Ocassional   Drug use: Never   Sexual activity: Not on file  Other Topics Concern   Not on file  Social History Narrative   Not on file   Social Determinants of Health   Financial Resource Strain: Not on file  Food Insecurity: Not on file  Transportation Needs: Not on file  Physical Activity: Not on file  Stress: Not on file  Social Connections: Not on file   Family History  Problem Relation Age of Onset   Thyroid disease Mother    CAD Mother    Stroke Mother    Breast cancer Mother    Outpatient Encounter Medications as of 10/03/2021  Medication Sig   levothyroxine (SYNTHROID) 75 MCG tablet Take 1 tablet (75 mcg total) by mouth daily before breakfast.   pravastatin (PRAVACHOL) 40 MG tablet Take 40 mg by mouth at bedtime.   [DISCONTINUED] levothyroxine (SYNTHROID) 88 MCG tablet Take 1 tablet (88 mcg total) by mouth daily before breakfast.   No facility-administered encounter medications on file as of 10/03/2021.   ALLERGIES: Allergies  Allergen Reactions   Zithromax [Azithromycin] Rash    VACCINATION STATUS:  There is no immunization history on file for this patient.  HPI Lynn Velasquez is 66 y.o.  female who who is being seen in follow-up for hypothyroidism, history of nodular goiter.    She was diagnosed with hypothyroidism at approximate age of 59 years secondary to Hashimoto thyroiditis.  She is currently on levothyroxine 88 mg p.o. daily for breakfast.  She reports very good compliance and consistency taking her medication.  Her previsit labs are consistent with slight over replacement.   She has minimally fluctuating body weight, progressively gained 6 pounds since last year.  .  She denies any palpitations, tremors, or any new complaints.   - She denies dysphagia, shortness of breath, nor voice change.  Her thyroid/neck ultrasound in December 2021 where unremarkable except heterogeneous thyroid consistent with Hashimoto's thyroiditis.  No discrete nodules.       Review of Systems Limited as above.  Objective:    BP 122/66    Pulse 76    Ht 5\' 2"  (1.575 m)    Wt 166 lb 6.4 oz (75.5 kg)    BMI 30.43 kg/m   Wt Readings from Last 3 Encounters:  10/03/21 166  lb 6.4 oz (75.5 kg)  03/27/21 165 lb 6.4 oz (75 kg)  09/11/20 160 lb (72.6 kg)    Physical Exam   Physical Exam- Limited  Constitutional:  Body mass index is 30.43 kg/m. , not in acute distress, normal state of mind   Recent Results (from the past 2160 hour(s))  TSH     Status: Abnormal   Collection Time: 09/27/21 10:02 AM  Result Value Ref Range   TSH 0.085 (L) 0.450 - 4.500 uIU/mL  T4, free     Status: Abnormal   Collection Time: 09/27/21 10:02 AM  Result Value Ref Range   Free T4 1.79 (H) 0.82 - 1.77 ng/dL    Assessment & Plan:   1. Hypothyroidism due to Hashimoto's thyroiditis 2.  History of nodular goiter status post biopsy.   Her previsit thyroid function tests are insistent with slight over replacement.  I discussed and lowered her levothyroxine to 75 mcg p.o. daily before breakfast.    - We discussed about the correct intake of her thyroid hormone, on empty stomach at fasting, with water, separated  by at least 30 minutes from breakfast and other medications,  and separated by more than 4 hours from calcium, iron, multivitamins, acid reflux medications (PPIs). -Patient is made aware of the fact that thyroid hormone replacement is needed for life, dose to be adjusted by periodic monitoring of thyroid function tests.  -She did have thyroid biopsy in 2002, per patient results are not available.  Patient was informed that it was benign. -However, due to palpable thyroid and history of multinodular goiter, she was offered surveillance thyroid ultrasound which she completed in December 2021.  Did not show any discrete nodules, heterogeneity consistent with medical history of Hashimoto's thyroiditis.    - she is advised to maintain close follow up with Benita Stabile, MD for primary care needs.    I spent 21 minutes in the care of the patient today including review of labs from Thyroid Function, CMP, and other relevant labs ; imaging/biopsy records (current and previous including abstractions from other facilities); face-to-face time discussing  her lab results and symptoms, medications doses, her options of short and long term treatment based on the latest standards of care / guidelines;   and documenting the encounter.  Lynn Velasquez  participated in the discussions, expressed understanding, and voiced agreement with the above plans.  All questions were answered to her satisfaction. she is encouraged to contact clinic should she have any questions or concerns prior to her return visit.   Follow up plan: Return in about 6 months (around 04/02/2022) for F/U with Pre-visit Labs.   Marquis Lunch, MD [facility] [address] [place], [place] [address] Phone: [phone]  Fax: [phone]     10/03/2021, 3:04 PM  This note was partially dictated with voice recognition software. Similar sounding words can be transcribed inadequately or may not   be corrected upon review.

## 2022-03-22 LAB — BASIC METABOLIC PANEL
BUN: 11 (ref 4–21)
Creatinine: 0.8 (ref 0.5–1.1)

## 2022-03-22 LAB — LIPID PANEL
Cholesterol: 152 (ref 0–200)
HDL: 45 (ref 35–70)
LDL Cholesterol: 87
Triglycerides: 110 (ref 40–160)

## 2022-03-22 LAB — TSH: TSH: 0.24 — AB (ref 0.41–5.90)

## 2022-03-22 LAB — VITAMIN D 25 HYDROXY (VIT D DEFICIENCY, FRACTURES): Vit D, 25-Hydroxy: 18.8

## 2022-04-04 ENCOUNTER — Ambulatory Visit: Payer: BC Managed Care – PPO | Admitting: "Endocrinology

## 2022-07-06 IMAGING — US US THYROID
1 series · 14 of 25 positions shown · non-contrast
Comparison: None.

CLINICAL DATA: Hypothyroid.

EXAM:
THYROID ULTRASOUND
TECHNIQUE: Ultrasound examination of the thyroid gland and adjacent soft
tissues was performed.

[Series 1: us thyroid · 14 of 61 slices shown]
[im 1/61]
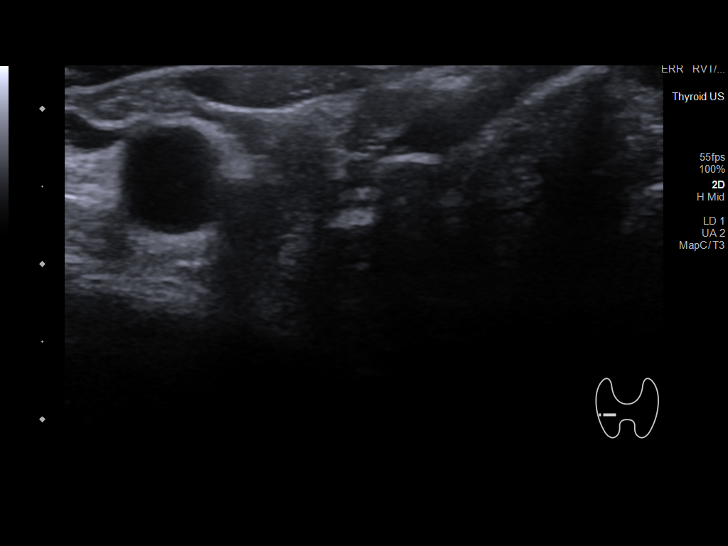
[im 6/61]
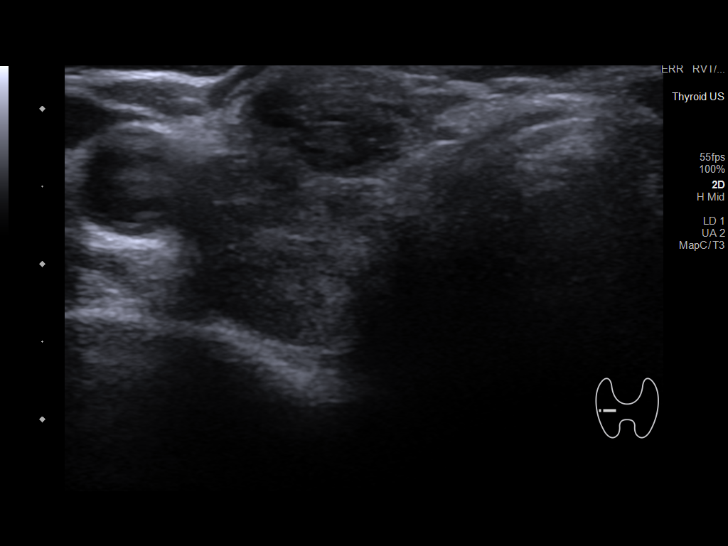
[im 11/61]
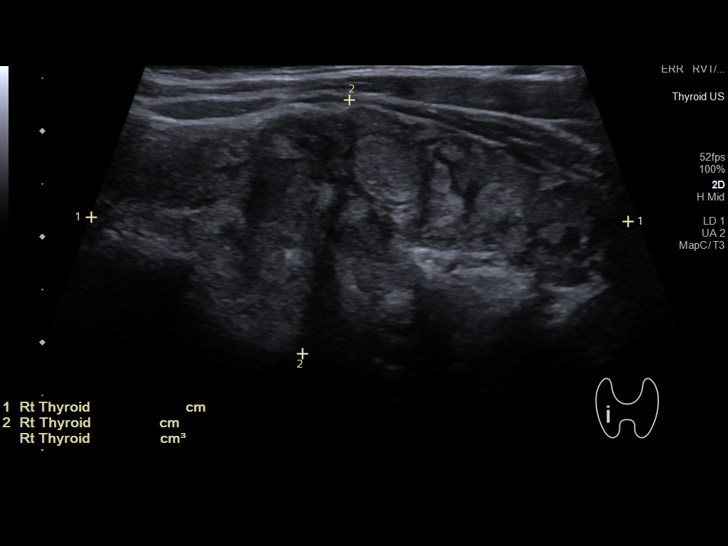
[im 16/61]
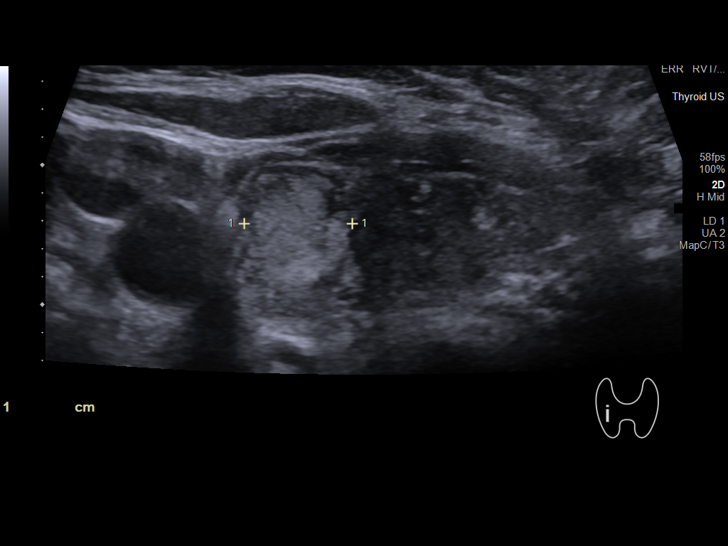
[im 21/61]
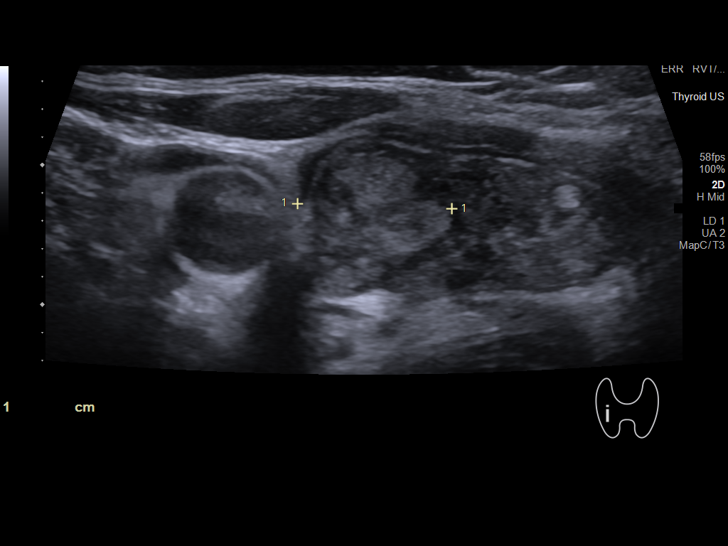
[im 23/61]
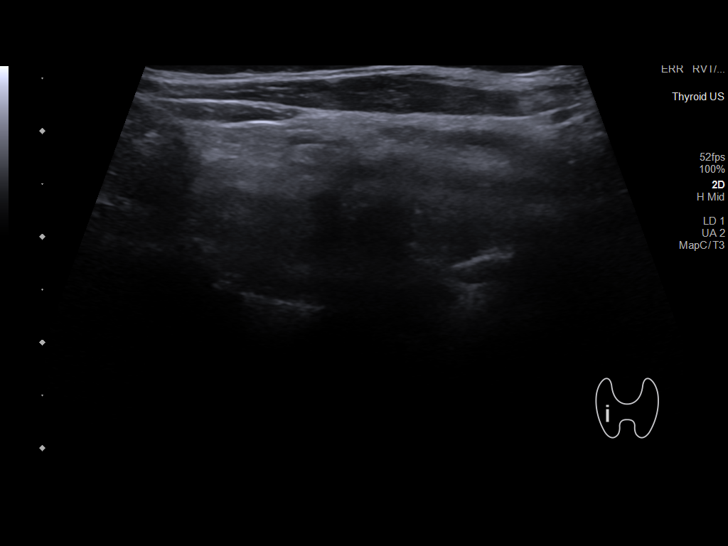
[im 28/61]
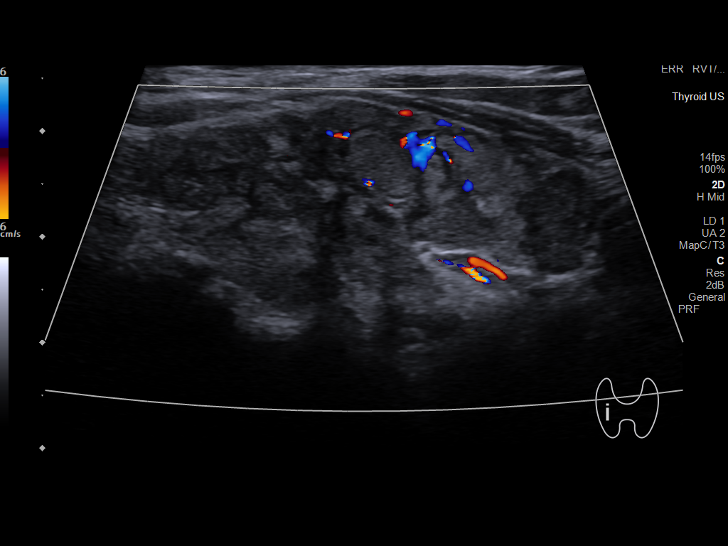
[im 33/61]
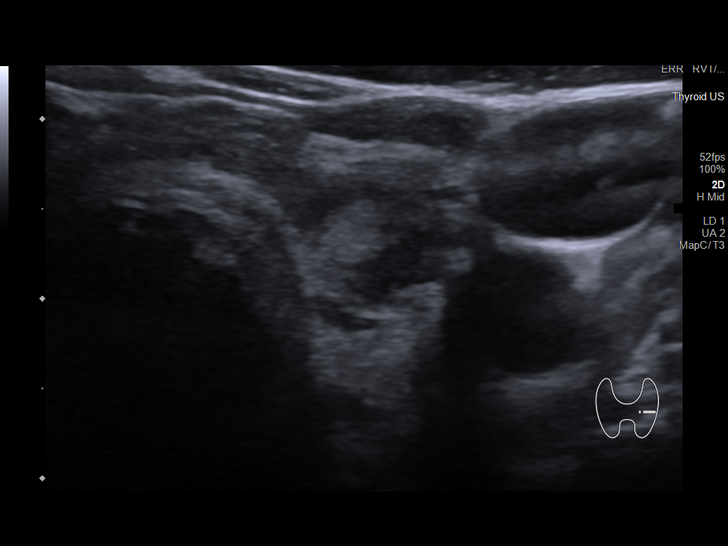
[im 38/61]
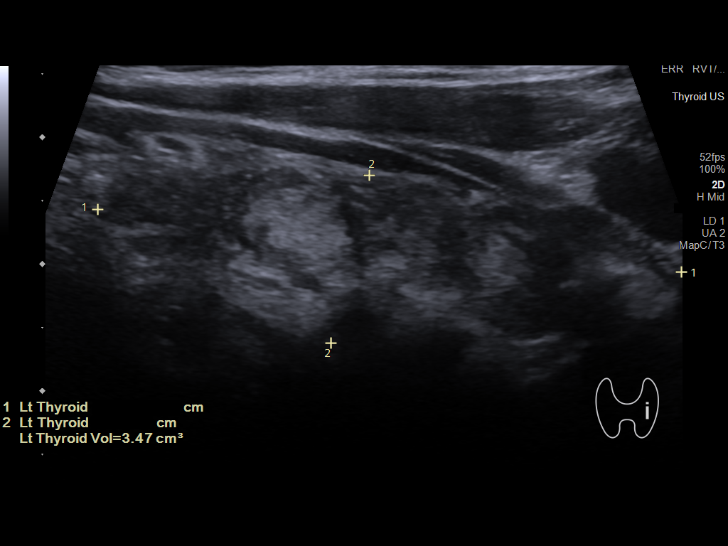
[im 41/61]
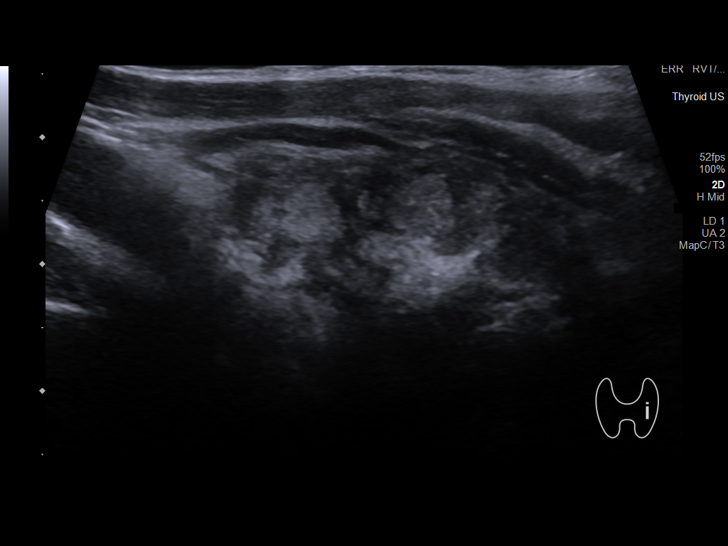
[im 46/61]
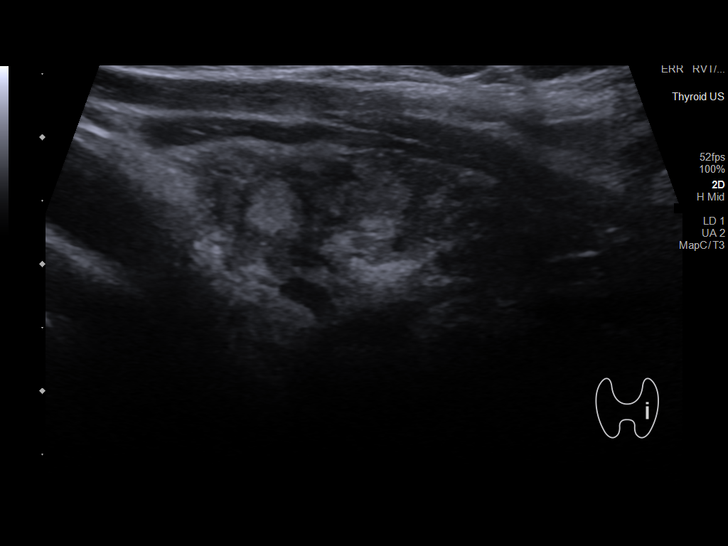
[im 51/61]
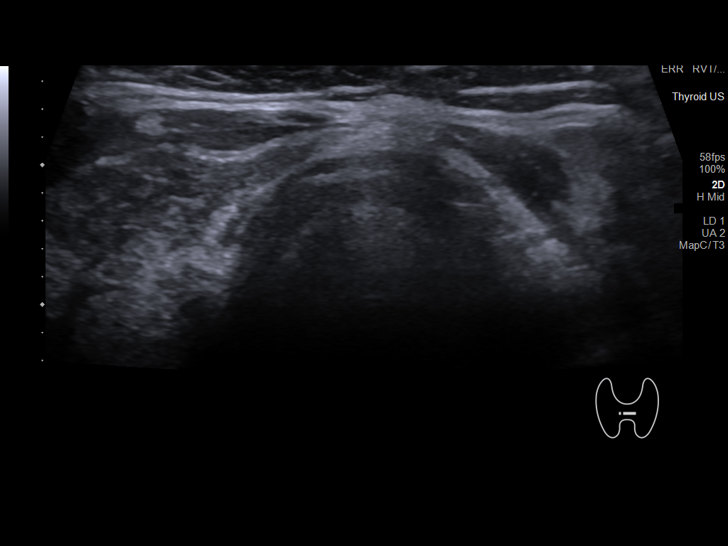
[im 56/61]
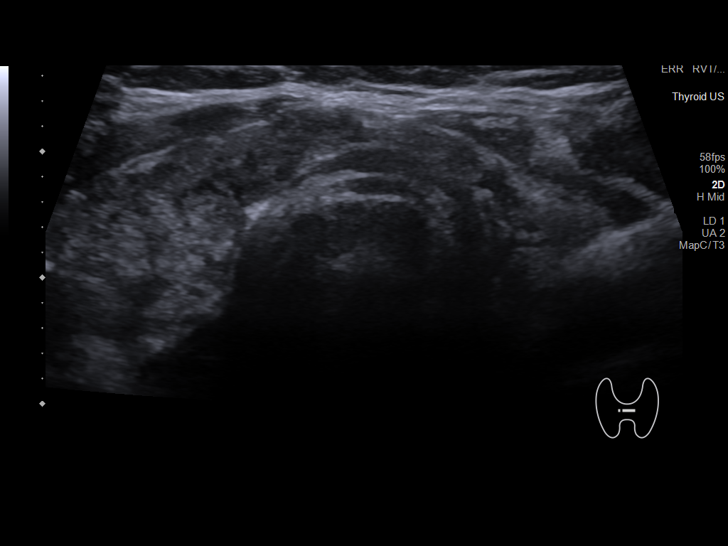
[im 61/61]
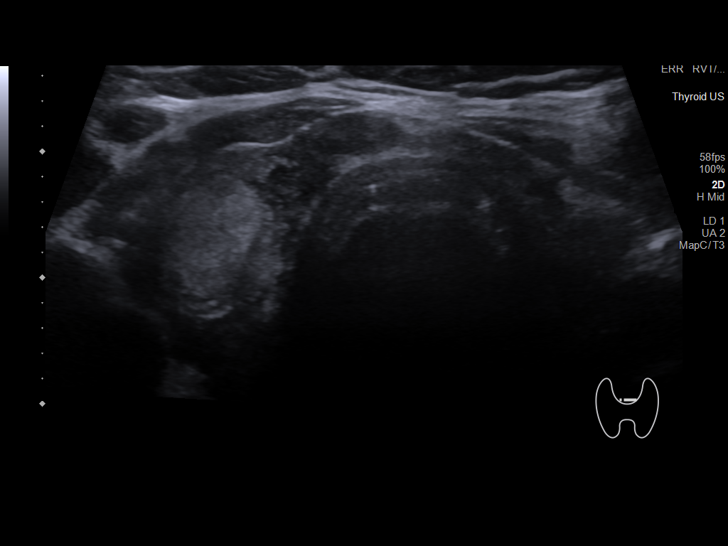

[14 of 25 positions shown; findings below may reference images not displayed]

FINDINGS: Parenchymal Echotexture: Markedly heterogenous

Isthmus: 0.2 cm

Right lobe: 5.1 x 2.4 x 1.4 cm

Left lobe: 4.6 x 1.4 x 1.2 cm

_________________________________________________________

Estimated total number of nodules >/= 1 cm: 0

Number of spongiform nodules >/=  2 cm not described below (TR1): 0

Number of mixed cystic and solid nodules >/= 1.5 cm not described
below (TR2): 0

_________________________________________________________

Diffuse pseudo nodular appearance without discrete, worrisome
nodule.
IMPRESSION: Diffuse, heterogeneously hypoechoic thyroid gland with diffuse
pseudo-nodularity. Findings are most compatible with sequela of
thyroiditis.

## 2022-12-11 ENCOUNTER — Other Ambulatory Visit (HOSPITAL_COMMUNITY): Payer: Self-pay | Admitting: Nurse Practitioner

## 2022-12-11 DIAGNOSIS — M5442 Lumbago with sciatica, left side: Secondary | ICD-10-CM

## 2022-12-25 ENCOUNTER — Encounter (HOSPITAL_COMMUNITY): Payer: Self-pay

## 2022-12-25 ENCOUNTER — Ambulatory Visit (HOSPITAL_COMMUNITY): Payer: BC Managed Care – PPO
